# Patient Record
Sex: Male | Born: 1945
Health system: Southern US, Community
[De-identification: ages and names within clinical notes are randomized; demographics above are authoritative.]

## PROBLEM LIST (undated history)

## (undated) DIAGNOSIS — I10 Essential (primary) hypertension: Secondary | ICD-10-CM

## (undated) HISTORY — DX: Essential (primary) hypertension: I10

---

## 2005-04-06 ENCOUNTER — Encounter: Admission: RE | Admit: 2005-04-06 | Discharge: 2005-04-06 | Payer: Self-pay | Admitting: Internal Medicine

## 2005-12-03 ENCOUNTER — Ambulatory Visit (HOSPITAL_COMMUNITY): Admission: RE | Admit: 2005-12-03 | Discharge: 2005-12-03 | Payer: Self-pay | Admitting: Gastroenterology

## 2006-07-01 IMAGING — CR DG CHEST 2V
3 series · 3 of 3 positions shown · non-contrast
Comparison: None.

CLINICAL DATA: Cough.

CHEST - 2 VIEW  04/06/2005:

[view not recorded (1 of 3)]
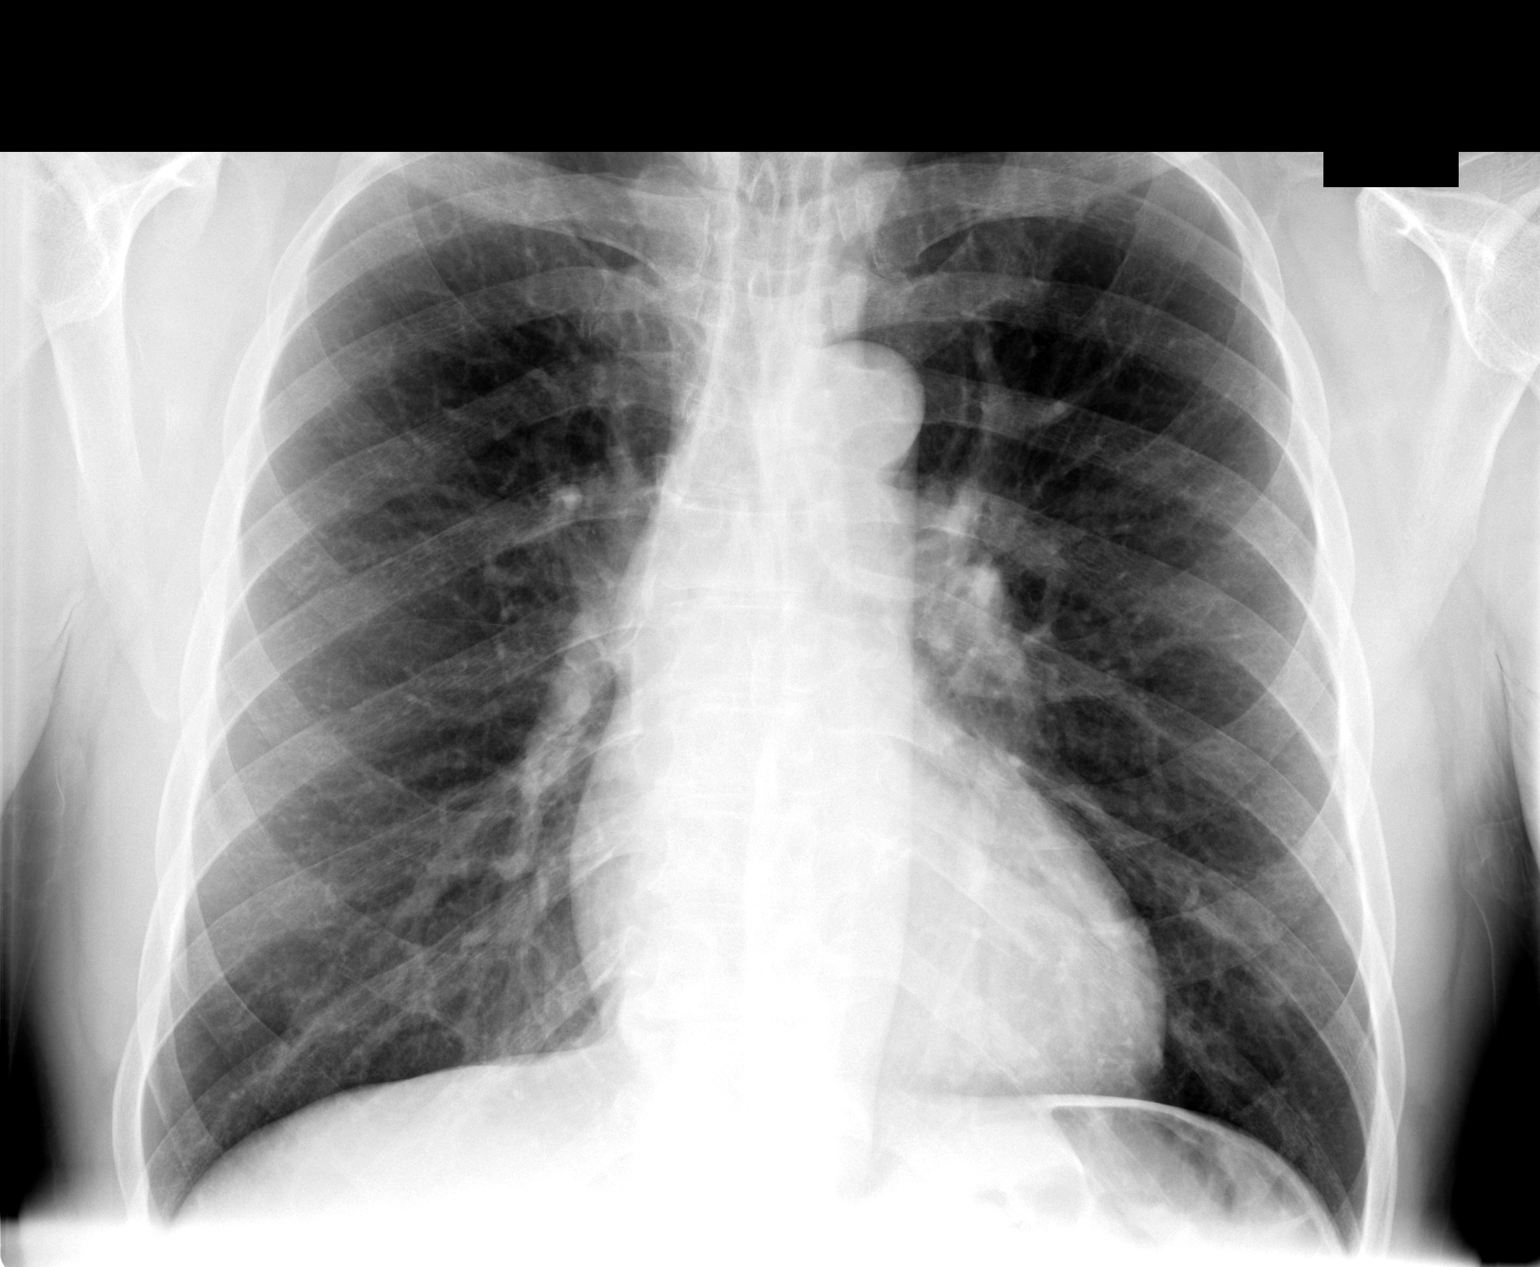

[view not recorded (2 of 3)]
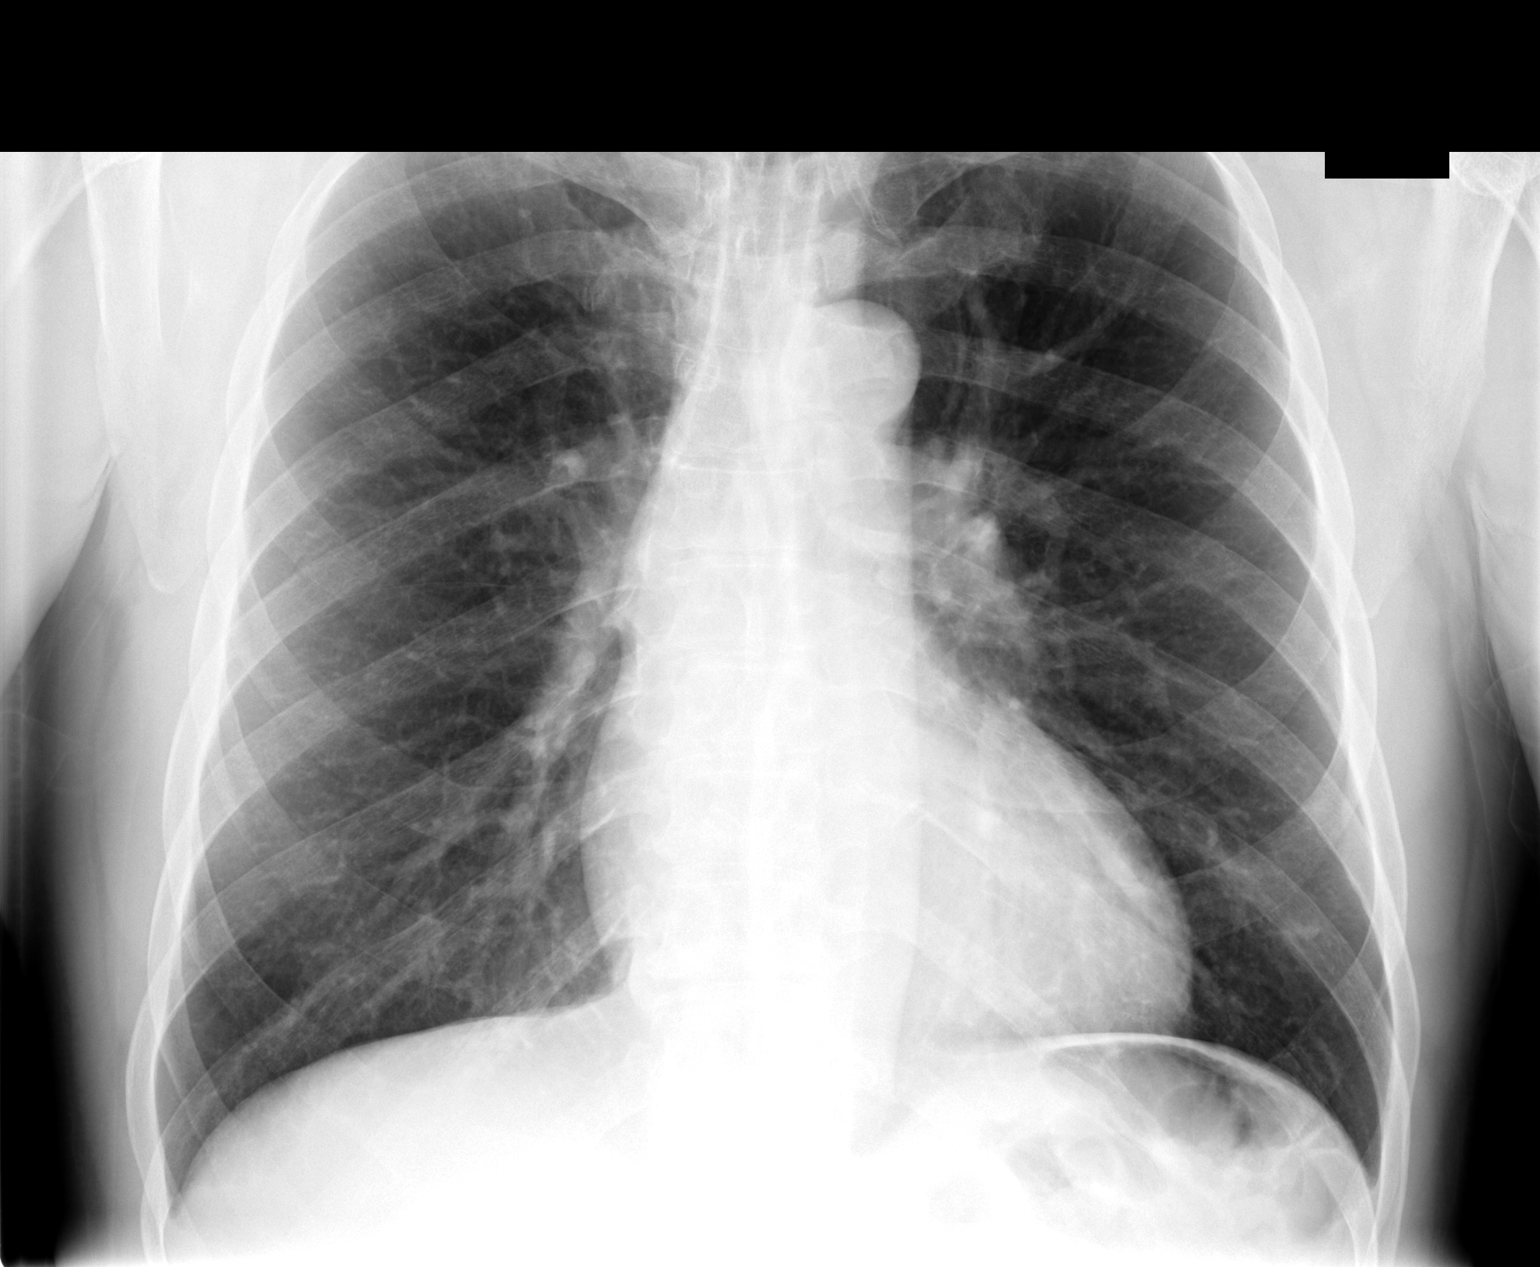

[view not recorded (3 of 3)]
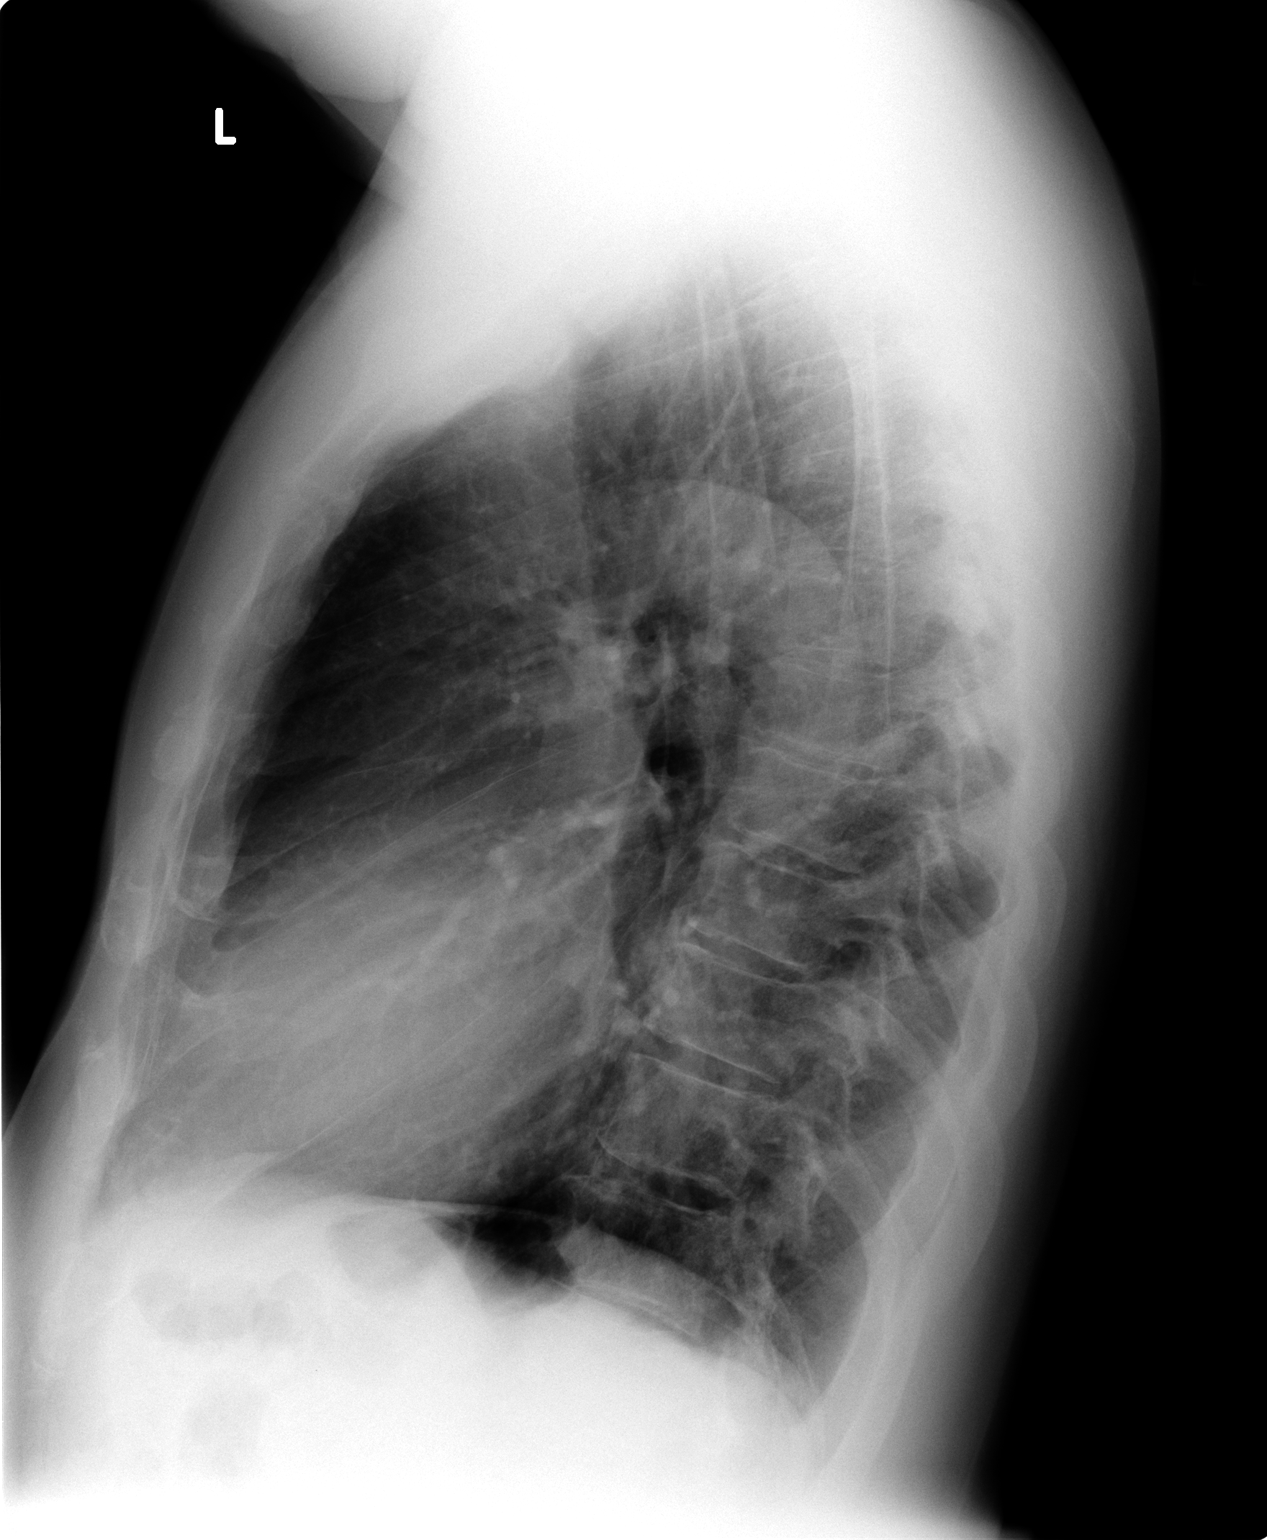

[3 of 3 positions shown; findings below may reference images not displayed]

FINDINGS: The heart is upper normal in size. The hilar and mediastinal contours
are otherwise unremarkable. The lungs appear clear. A curvilinear opacity at the
left lung base I believe likely represents the nipple. There are no pleural
effusions. Mild degenerative changes are present in the thoracic spine.
IMPRESSION: Borderline heart size. No evidence of acute disease.

## 2016-08-27 DIAGNOSIS — Z23 Encounter for immunization: Secondary | ICD-10-CM | POA: Diagnosis not present

## 2017-01-27 DIAGNOSIS — R7309 Other abnormal glucose: Secondary | ICD-10-CM | POA: Diagnosis not present

## 2017-01-27 DIAGNOSIS — E782 Mixed hyperlipidemia: Secondary | ICD-10-CM | POA: Diagnosis not present

## 2017-01-27 DIAGNOSIS — Z8546 Personal history of malignant neoplasm of prostate: Secondary | ICD-10-CM | POA: Diagnosis not present

## 2017-01-27 DIAGNOSIS — Z1211 Encounter for screening for malignant neoplasm of colon: Secondary | ICD-10-CM | POA: Diagnosis not present

## 2017-01-27 DIAGNOSIS — Z13228 Encounter for screening for other metabolic disorders: Secondary | ICD-10-CM | POA: Diagnosis not present

## 2017-05-06 DIAGNOSIS — N390 Urinary tract infection, site not specified: Secondary | ICD-10-CM | POA: Diagnosis not present

## 2017-05-06 DIAGNOSIS — Z1211 Encounter for screening for malignant neoplasm of colon: Secondary | ICD-10-CM | POA: Diagnosis not present

## 2017-05-06 DIAGNOSIS — R030 Elevated blood-pressure reading, without diagnosis of hypertension: Secondary | ICD-10-CM | POA: Diagnosis not present

## 2017-05-06 DIAGNOSIS — Z23 Encounter for immunization: Secondary | ICD-10-CM | POA: Diagnosis not present

## 2017-05-06 DIAGNOSIS — E782 Mixed hyperlipidemia: Secondary | ICD-10-CM | POA: Diagnosis not present

## 2017-05-06 DIAGNOSIS — Z0000 Encounter for general adult medical examination without abnormal findings: Secondary | ICD-10-CM | POA: Diagnosis not present

## 2017-05-13 DIAGNOSIS — R030 Elevated blood-pressure reading, without diagnosis of hypertension: Secondary | ICD-10-CM | POA: Diagnosis not present

## 2017-05-18 DIAGNOSIS — K4090 Unilateral inguinal hernia, without obstruction or gangrene, not specified as recurrent: Secondary | ICD-10-CM | POA: Diagnosis not present

## 2017-05-18 DIAGNOSIS — Z1211 Encounter for screening for malignant neoplasm of colon: Secondary | ICD-10-CM | POA: Diagnosis not present

## 2017-05-18 DIAGNOSIS — C61 Malignant neoplasm of prostate: Secondary | ICD-10-CM | POA: Diagnosis not present

## 2017-06-03 DIAGNOSIS — R030 Elevated blood-pressure reading, without diagnosis of hypertension: Secondary | ICD-10-CM | POA: Diagnosis not present

## 2017-06-21 DIAGNOSIS — Z1211 Encounter for screening for malignant neoplasm of colon: Secondary | ICD-10-CM | POA: Diagnosis not present

## 2017-06-21 DIAGNOSIS — K627 Radiation proctitis: Secondary | ICD-10-CM | POA: Diagnosis not present

## 2017-07-15 DIAGNOSIS — I10 Essential (primary) hypertension: Secondary | ICD-10-CM | POA: Diagnosis not present

## 2017-08-26 DIAGNOSIS — I10 Essential (primary) hypertension: Secondary | ICD-10-CM | POA: Diagnosis not present

## 2017-08-26 DIAGNOSIS — Z23 Encounter for immunization: Secondary | ICD-10-CM | POA: Diagnosis not present

## 2017-11-07 DIAGNOSIS — E782 Mixed hyperlipidemia: Secondary | ICD-10-CM | POA: Diagnosis not present

## 2017-11-07 DIAGNOSIS — I10 Essential (primary) hypertension: Secondary | ICD-10-CM | POA: Diagnosis not present

## 2018-05-18 DIAGNOSIS — E049 Nontoxic goiter, unspecified: Secondary | ICD-10-CM | POA: Diagnosis not present

## 2018-05-18 DIAGNOSIS — I10 Essential (primary) hypertension: Secondary | ICD-10-CM | POA: Diagnosis not present

## 2018-05-18 DIAGNOSIS — N390 Urinary tract infection, site not specified: Secondary | ICD-10-CM | POA: Diagnosis not present

## 2018-05-18 DIAGNOSIS — E782 Mixed hyperlipidemia: Secondary | ICD-10-CM | POA: Diagnosis not present

## 2018-05-31 ENCOUNTER — Other Ambulatory Visit: Payer: Self-pay

## 2018-07-06 DIAGNOSIS — N3946 Mixed incontinence: Secondary | ICD-10-CM | POA: Diagnosis not present

## 2018-07-06 DIAGNOSIS — R351 Nocturia: Secondary | ICD-10-CM | POA: Diagnosis not present

## 2018-07-06 DIAGNOSIS — R350 Frequency of micturition: Secondary | ICD-10-CM | POA: Diagnosis not present

## 2018-08-17 DIAGNOSIS — R350 Frequency of micturition: Secondary | ICD-10-CM | POA: Diagnosis not present

## 2018-08-17 DIAGNOSIS — N3946 Mixed incontinence: Secondary | ICD-10-CM | POA: Diagnosis not present

## 2018-10-10 DIAGNOSIS — N3946 Mixed incontinence: Secondary | ICD-10-CM | POA: Diagnosis not present

## 2018-10-10 DIAGNOSIS — R350 Frequency of micturition: Secondary | ICD-10-CM | POA: Diagnosis not present

## 2018-10-10 DIAGNOSIS — R351 Nocturia: Secondary | ICD-10-CM | POA: Diagnosis not present

## 2018-10-11 DIAGNOSIS — Z23 Encounter for immunization: Secondary | ICD-10-CM | POA: Diagnosis not present

## 2018-11-16 ENCOUNTER — Ambulatory Visit: Payer: Self-pay | Admitting: Nurse Practitioner

## 2019-01-11 DIAGNOSIS — R350 Frequency of micturition: Secondary | ICD-10-CM | POA: Diagnosis not present

## 2019-01-11 DIAGNOSIS — N3946 Mixed incontinence: Secondary | ICD-10-CM | POA: Diagnosis not present

## 2019-02-21 DIAGNOSIS — N3946 Mixed incontinence: Secondary | ICD-10-CM | POA: Diagnosis not present

## 2019-02-21 DIAGNOSIS — R351 Nocturia: Secondary | ICD-10-CM | POA: Diagnosis not present

## 2019-02-21 DIAGNOSIS — R350 Frequency of micturition: Secondary | ICD-10-CM | POA: Diagnosis not present

## 2019-02-28 DIAGNOSIS — N3946 Mixed incontinence: Secondary | ICD-10-CM | POA: Diagnosis not present

## 2019-02-28 DIAGNOSIS — R350 Frequency of micturition: Secondary | ICD-10-CM | POA: Diagnosis not present

## 2019-03-22 DIAGNOSIS — R350 Frequency of micturition: Secondary | ICD-10-CM | POA: Diagnosis not present

## 2019-03-22 DIAGNOSIS — N3946 Mixed incontinence: Secondary | ICD-10-CM | POA: Diagnosis not present

## 2019-03-30 DIAGNOSIS — R350 Frequency of micturition: Secondary | ICD-10-CM | POA: Diagnosis not present

## 2019-04-06 DIAGNOSIS — R350 Frequency of micturition: Secondary | ICD-10-CM | POA: Diagnosis not present

## 2019-04-12 DIAGNOSIS — R350 Frequency of micturition: Secondary | ICD-10-CM | POA: Diagnosis not present

## 2019-04-19 DIAGNOSIS — R350 Frequency of micturition: Secondary | ICD-10-CM | POA: Diagnosis not present

## 2019-04-26 DIAGNOSIS — N3946 Mixed incontinence: Secondary | ICD-10-CM | POA: Diagnosis not present

## 2019-04-26 DIAGNOSIS — R350 Frequency of micturition: Secondary | ICD-10-CM | POA: Diagnosis not present

## 2019-04-26 DIAGNOSIS — R351 Nocturia: Secondary | ICD-10-CM | POA: Diagnosis not present

## 2019-05-03 DIAGNOSIS — R350 Frequency of micturition: Secondary | ICD-10-CM | POA: Diagnosis not present

## 2019-05-10 DIAGNOSIS — R350 Frequency of micturition: Secondary | ICD-10-CM | POA: Diagnosis not present

## 2019-05-17 DIAGNOSIS — R350 Frequency of micturition: Secondary | ICD-10-CM | POA: Diagnosis not present

## 2019-05-24 ENCOUNTER — Ambulatory Visit: Payer: Self-pay | Admitting: Nurse Practitioner

## 2019-05-24 ENCOUNTER — Ambulatory Visit: Payer: Self-pay

## 2019-05-24 DIAGNOSIS — R350 Frequency of micturition: Secondary | ICD-10-CM | POA: Diagnosis not present

## 2019-05-31 DIAGNOSIS — R350 Frequency of micturition: Secondary | ICD-10-CM | POA: Diagnosis not present

## 2019-06-07 DIAGNOSIS — R351 Nocturia: Secondary | ICD-10-CM | POA: Diagnosis not present

## 2019-06-07 DIAGNOSIS — R350 Frequency of micturition: Secondary | ICD-10-CM | POA: Diagnosis not present

## 2019-06-10 ENCOUNTER — Other Ambulatory Visit: Payer: Self-pay | Admitting: Nurse Practitioner

## 2019-06-13 ENCOUNTER — Other Ambulatory Visit: Payer: Self-pay

## 2019-06-13 ENCOUNTER — Encounter: Payer: Self-pay | Admitting: Nurse Practitioner

## 2019-06-13 ENCOUNTER — Ambulatory Visit (INDEPENDENT_AMBULATORY_CARE_PROVIDER_SITE_OTHER): Payer: Medicare Other | Admitting: Nurse Practitioner

## 2019-06-13 ENCOUNTER — Ambulatory Visit (INDEPENDENT_AMBULATORY_CARE_PROVIDER_SITE_OTHER): Payer: Medicare Other

## 2019-06-13 VITALS — BP 148/78 | HR 62 | Temp 98.7°F | Ht 73.0 in | Wt 204.2 lb

## 2019-06-13 VITALS — BP 148/78 | HR 62 | Temp 98.7°F | Ht 72.0 in | Wt 204.2 lb

## 2019-06-13 DIAGNOSIS — E785 Hyperlipidemia, unspecified: Secondary | ICD-10-CM

## 2019-06-13 DIAGNOSIS — I10 Essential (primary) hypertension: Secondary | ICD-10-CM

## 2019-06-13 DIAGNOSIS — Z0000 Encounter for general adult medical examination without abnormal findings: Secondary | ICD-10-CM

## 2019-06-13 DIAGNOSIS — Z23 Encounter for immunization: Secondary | ICD-10-CM

## 2019-06-13 LAB — POCT URINALYSIS DIPSTICK
Bilirubin, UA: NEGATIVE
Blood, UA: NEGATIVE
Glucose, UA: NEGATIVE
Ketones, UA: NEGATIVE
Leukocytes, UA: NEGATIVE
Nitrite, UA: NEGATIVE
Protein, UA: NEGATIVE
Spec Grav, UA: 1.025 (ref 1.010–1.025)
Urobilinogen, UA: 0.2 E.U./dL
pH, UA: 7 (ref 5.0–8.0)

## 2019-06-13 LAB — POCT UA - MICROALBUMIN
Albumin/Creatinine Ratio, Urine, POC: <30
Creatinine, POC: 200 mg/dL
Microalbumin Ur, POC: 10 mg/L

## 2019-06-13 MED ORDER — BOOSTRIX 5-2.5-18.5 LF-MCG/0.5 IM SUSP: 0.5000 mL | Freq: Once | INTRAMUSCULAR | 0 refills | Status: AC

## 2019-06-13 NOTE — Patient Instructions (Signed)

## 2019-06-13 NOTE — Patient Instructions (Signed)
David Wood , Thank you for taking time to come for your Medicare Wellness Visit. I appreciate your ongoing commitment to your health goals. Please review the following plan we discussed and let me know if I can assist you in the future.   Screening recommendations/referrals: Colonoscopy: 06/2017 Recommended yearly ophthalmology/optometry visit for glaucoma screening and checkup Recommended yearly dental visit for hygiene and checkup  Vaccinations: Influenza vaccine: 10/2018 Pneumococcal vaccine: 05/2017 Tdap vaccine: due Shingles vaccine: discussed    Advanced directives: Advance directive discussed with you today. Even though you declined this today please call our office should you change your mind and we can give you the proper paperwork for you to fill out.   Conditions/risks identified: overweight  Next appointment: 10/09/2019 at 8:45  Preventive Care 16 Years and Older, Male Preventive care refers to lifestyle choices and visits with your health care provider that can promote health and wellness. What does preventive care include?  A yearly physical exam. This is also called an annual well check.  Dental exams once or twice a year.  Routine eye exams. Ask your health care provider how often you should have your eyes checked.  Personal lifestyle choices, including:  Daily care of your teeth and gums.  Regular physical activity.  Eating a healthy diet.  Avoiding tobacco and drug use.  Limiting alcohol use.  Practicing safe sex.  Taking low doses of aspirin every day.  Taking vitamin and mineral supplements as recommended by your health care provider. What happens during an annual well check? The services and screenings done by your health care provider during your annual well check will depend on your age, overall health, lifestyle risk factors, and family history of disease. Counseling  Your health care provider may ask you questions about your:  Alcohol use.   Tobacco use.  Drug use.  Emotional well-being.  Home and relationship well-being.  Sexual activity.  Eating habits.  History of falls.  Memory and ability to understand (cognition).  Work and work Statistician. Screening  You may have the following tests or measurements:  Height, weight, and BMI.  Blood pressure.  Lipid and cholesterol levels. These may be checked every 5 years, or more frequently if you are over 34 years old.  Skin check.  Lung cancer screening. You may have this screening every year starting at age 39 if you have a 30-pack-year history of smoking and currently smoke or have quit within the past 15 years.  Fecal occult blood test (FOBT) of the stool. You may have this test every year starting at age 63.  Flexible sigmoidoscopy or colonoscopy. You may have a sigmoidoscopy every 5 years or a colonoscopy every 10 years starting at age 80.  Prostate cancer screening. Recommendations will vary depending on your family history and other risks.  Hepatitis C blood test.  Hepatitis B blood test.  Sexually transmitted disease (STD) testing.  Diabetes screening. This is done by checking your blood sugar (glucose) after you have not eaten for a while (fasting). You may have this done every 1-3 years.  Abdominal aortic aneurysm (AAA) screening. You may need this if you are a current or former smoker.  Osteoporosis. You may be screened starting at age 40 if you are at high risk. Talk with your health care provider about your test results, treatment options, and if necessary, the need for more tests. Vaccines  Your health care provider may recommend certain vaccines, such as:  Influenza vaccine. This is recommended every year.  Tetanus, diphtheria, and acellular pertussis (Tdap, Td) vaccine. You may need a Td booster every 10 years.  Zoster vaccine. You may need this after age 82.  Pneumococcal 13-valent conjugate (PCV13) vaccine. One dose is recommended  after age 74.  Pneumococcal polysaccharide (PPSV23) vaccine. One dose is recommended after age 59. Talk to your health care provider about which screenings and vaccines you need and how often you need them. This information is not intended to replace advice given to you by your health care provider. Make sure you discuss any questions you have with your health care provider. Document Released: 11/14/2015 Document Revised: 07/07/2016 Document Reviewed: 08/19/2015 Elsevier Interactive Patient Education  2017 [place] Prevention in the Home Falls can cause injuries. They can happen to people of all ages. There are many things you can do to make your home safe and to help prevent falls. What can I do on the outside of my home?  Regularly fix the edges of walkways and driveways and fix any cracks.  Remove anything that might make you trip as you walk through a door, such as a raised step or threshold.  Trim any bushes or trees on the path to your home.  Use bright outdoor lighting.  Clear any walking paths of anything that might make someone trip, such as rocks or tools.  Regularly check to see if handrails are loose or broken. Make sure that both sides of any steps have handrails.  Any raised decks and porches should have guardrails on the edges.  Have any leaves, snow, or ice cleared regularly.  Use sand or salt on walking paths during winter.  Clean up any spills in your garage right away. This includes oil or grease spills. What can I do in the bathroom?  Use night lights.  Install grab bars by the toilet and in the tub and shower. Do not use towel bars as grab bars.  Use non-skid mats or decals in the tub or shower.  If you need to sit down in the shower, use a plastic, non-slip stool.  Keep the floor dry. Clean up any water that spills on the floor as soon as it happens.  Remove soap buildup in the tub or shower regularly.  Attach bath mats securely with  double-sided non-slip rug tape.  Do not have throw rugs and other things on the floor that can make you trip. What can I do in the bedroom?  Use night lights.  Make sure that you have a light by your bed that is easy to reach.  Do not use any sheets or blankets that are too big for your bed. They should not hang down onto the floor.  Have a firm chair that has side arms. You can use this for support while you get dressed.  Do not have throw rugs and other things on the floor that can make you trip. What can I do in the kitchen?  Clean up any spills right away.  Avoid walking on wet floors.  Keep items that you use a lot in easy-to-reach places.  If you need to reach something above you, use a strong step stool that has a grab bar.  Keep electrical cords out of the way.  Do not use floor polish or wax that makes floors slippery. If you must use wax, use non-skid floor wax.  Do not have throw rugs and other things on the floor that can make you trip. What can I do  with my stairs?  Do not leave any items on the stairs.  Make sure that there are handrails on both sides of the stairs and use them. Fix handrails that are broken or loose. Make sure that handrails are as long as the stairways.  Check any carpeting to make sure that it is firmly attached to the stairs. Fix any carpet that is loose or worn.  Avoid having throw rugs at the top or bottom of the stairs. If you do have throw rugs, attach them to the floor with carpet tape.  Make sure that you have a light switch at the top of the stairs and the bottom of the stairs. If you do not have them, ask someone to add them for you. What else can I do to help prevent falls?  Wear shoes that:  Do not have high heels.  Have rubber bottoms.  Are comfortable and fit you well.  Are closed at the toe. Do not wear sandals.  If you use a stepladder:  Make sure that it is fully opened. Do not climb a closed stepladder.  Make  sure that both sides of the stepladder are locked into place.  Ask someone to hold it for you, if possible.  Clearly mark and make sure that you can see:  Any grab bars or handrails.  First and last steps.  Where the edge of each step is.  Use tools that help you move around (mobility aids) if they are needed. These include:  Canes.  Walkers.  Scooters.  Crutches.  Turn on the lights when you go into a dark area. Replace any light bulbs as soon as they burn out.  Set up your furniture so you have a clear path. Avoid moving your furniture around.  If any of your floors are uneven, fix them.  If there are any pets around you, be aware of where they are.  Review your medicines with your doctor. Some medicines can make you feel dizzy. This can increase your chance of falling. Ask your doctor what other things that you can do to help prevent falls. This information is not intended to replace advice given to you by your health care provider. Make sure you discuss any questions you have with your health care provider. Document Released: 08/14/2009 Document Revised: 03/25/2016 Document Reviewed: 11/22/2014 Elsevier Interactive Patient Education  2017 [employer].

## 2019-06-13 NOTE — Progress Notes (Signed)
  Subjective:     Patient ID: David Wood , male    DOB: 08-04-1946 , 73 y.o.   MRN: [id number]   Chief Complaint  Patient presents with  . Hypertension    HPI  Walking 7 days a week for about one hour  Hypertension This is a chronic problem. The current episode started more than 1 year ago. The problem is unchanged. The problem is controlled. Pertinent negatives include no anxiety, chest pain, headaches or palpitations. There are no associated agents to hypertension. Risk factors for coronary artery disease include male gender. Past treatments include angiotensin blockers. The current treatment provides no improvement. There are no compliance problems.  There is no history of chronic renal disease.     Past Medical History:  Diagnosis Date  . Hypertension      No family history on file.   Current Outpatient Medications:  .  olmesartan (BENICAR) 20 MG tablet, TAKE 1/2 TABLET BY ORAL ROUTE EVERY DAY, Disp: 30 tablet, Rfl: 0 .  Tdap (BOOSTRIX) 5-2.5-18.5 LF-MCG/0.5 injection, Inject 0.5 mLs into the muscle once for 1 dose., Disp: 0.5 mL, Rfl: 0   No Known Allergies   Review of Systems  Constitutional: Negative.   Respiratory: Negative.   Cardiovascular: Negative.  Negative for chest pain, palpitations and leg swelling.  Neurological: Negative.  Negative for dizziness and headaches.  Psychiatric/Behavioral: Negative.      Today's Vitals   06/13/19 0902  BP: (!) 148/78  Pulse: 62  Temp: 98.7 F (37.1 C)  TempSrc: Oral  Weight: 204 lb 3.2 oz (92.6 kg)  Height: _0  (1.854 m)  PainSc: 0-No pain   Body mass index is 26.94 kg/m.   Objective:  Physical Exam Constitutional:      Appearance: Normal appearance.  Cardiovascular:     Rate and Rhythm: Normal rate and regular rhythm.     Pulses: Normal pulses.     Heart sounds: Normal heart sounds. No murmur.  Pulmonary:     Effort: Pulmonary effort is normal. No respiratory distress.     Breath sounds: Normal  breath sounds.  Skin:    General: Skin is warm and dry.     Capillary Refill: Capillary refill takes less than 2 seconds.  Neurological:     General: No focal deficit present.     Mental Status: He is alert and oriented to person, place, and time.  Psychiatric:        Mood and Affect: Mood normal.        Behavior: Behavior normal.        Thought Content: Thought content normal.        Judgment: Judgment normal.         Assessment And Plan:     1. Essential hypertension . B/P is fairly controlled, slightly elevated today however I will not make any changes today  . BMP ordered to check renal function.  . The importance of regular exercise and dietary modification was stressed to the patient.  - BMP8+eGFR  2. Elevated lipids  LDLs were slightly elevated at last visit, will recheck today  Encouraged to avoid high fat and fried foods. - Lipid panel   Minette Brine, FNP    THE PATIENT IS ENCOURAGED TO PRACTICE SOCIAL DISTANCING DUE TO THE COVID-19 PANDEMIC.

## 2019-06-13 NOTE — Progress Notes (Signed)
Subjective:   David Wood is a 73 y.o. male who presents for Medicare Annual/Subsequent preventive examination.  Review of Systems:  n/a Cardiac Risk Factors include: advanced age (>40men, >72 women);hypertension;male gender     Objective:    Vitals: BP (!) 148/78 (BP Location: Left Arm, Patient Position: Sitting, Cuff Size: Normal)   Pulse 62   Temp 98.7 F (37.1 C) (Oral)   Ht 6' (1.829 m)   Wt 204 lb 3.2 oz (92.6 kg)   SpO2 97%   BMI 27.69 kg/m   Body mass index is 27.69 kg/m.  Advanced Directives 06/13/2019  Does Patient Have a Medical Advance Directive? No  Would patient like information on creating a medical advance directive? No - Patient declined    Tobacco Social History   Tobacco Use  Smoking Status Never Smoker  Smokeless Tobacco Never Used     Counseling given: Not Answered   Clinical Intake:  Pre-visit preparation completed: Yes  Pain : No/denies pain     Nutritional Status: BMI 25 -29 Overweight Nutritional Risks: None Diabetes: No  How often do you need to have someone help you when you read instructions, pamphlets, or other written materials from your doctor or pharmacy?: 1 - Never What is the last grade level you completed in school?: 2 years college  Interpreter Needed?: No  Information entered by :: NAllen LPN  Past Medical History:  Diagnosis Date  . Hypertension    Past Surgical History:  Procedure Laterality Date  . HERNIA REPAIR  1998  . [place]   History reviewed. No pertinent family history. Social History   Socioeconomic History  . Marital status: Married    Spouse name: Not on file  . Number of children: Not on file  . Years of education: Not on file  . Highest education level: Not on file  Occupational History  . Occupation: retired  Scientific laboratory technician  . Financial resource strain: Not hard at all  . Food insecurity    Worry: Never true    Inability: Never true  . Transportation needs   Medical: No    Non-medical: No  Tobacco Use  . Smoking status: Never Smoker  . Smokeless tobacco: Never Used  Substance and Sexual Activity  . Alcohol use: Not Currently  . Drug use: Not Currently  . Sexual activity: Yes  Lifestyle  . Physical activity    Days per week: 7 days    Minutes per session: 60 min  . Stress: Not at all  Relationships  . Social Herbalist on phone: Not on file    Gets together: Not on file    Attends religious service: Not on file    Active member of club or organization: Not on file    Attends meetings of clubs or organizations: Not on file    Relationship status: Not on file  Other Topics Concern  . Not on file  Social History Narrative  . Not on file    Outpatient Encounter Medications as of 06/13/2019  Medication Sig  . olmesartan (BENICAR) 20 MG tablet TAKE 1/2 TABLET BY ORAL ROUTE EVERY DAY   No facility-administered encounter medications on file as of 06/13/2019.     Activities of Daily Living In your present state of health, do you have any difficulty performing the following activities: 06/13/2019  Hearing? N  Vision? N  Difficulty concentrating or making decisions? N  Walking or climbing stairs? N  Dressing or  bathing? N  Doing errands, shopping? N  Preparing Food and eating ? N  Using the Toilet? N  In the past six months, have you accidently leaked urine? Y  Do you have problems with loss of bowel control? N  Managing your Medications? N  Managing your Finances? N  Housekeeping or managing your Housekeeping? N  Some recent data might be hidden    Patient Care Team: Minette Brine, FNP as PCP - General (General Practice)   Assessment:   This is a routine wellness examination for David Wood.  Exercise Activities and Dietary recommendations Current Exercise Habits: Home exercise routine, Type of exercise: walking, Time (Minutes): 60, Frequency (Times/Week): 7, Weekly Exercise (Minutes/Week): 420  Goals    . Weight (lb)  < 200 lb (90.7 kg) (pt-stated)       Fall Risk Fall Risk  06/13/2019 05/31/2018  Falls in the past year? 0 No  Comment - Emmi Telephone Survey: data to providers prior to load  Risk for fall due to : Medication side effect -  Follow up Falls evaluation completed;Education provided;Falls prevention discussed -   Is the patient's home free of loose throw rugs in walkways, pet beds, electrical cords, etc?   yes      Grab bars in the bathroom? yes      Handrails on the stairs?   n/a      Adequate lighting?   yes  Timed Get Up and Go Performed: n/a  Depression Screen PHQ 2/9 Scores 06/13/2019  PHQ - 2 Score 0  PHQ- 9 Score 0    Cognitive Function     6CIT Screen 06/13/2019  What Year? 0 points  What month? 0 points  What time? 0 points  Count back from 20 0 points  Months in reverse 0 points  Repeat phrase 0 points  Total Score 0     There is no immunization history on file for this patient.  Qualifies for Shingles Vaccine? yes  Screening Tests Health Maintenance  Topic Date Due  . Hepatitis C Screening  [birth year]  . TETANUS/TDAP  01/12/1967  . COLONOSCOPY  01/11/1998  . PNA vac Low Risk Adult (1 of 2 - PCV13) 01/11/2013  . INFLUENZA VACCINE  06/02/2019   Cancer Screenings: Lung: Low Dose CT Chest recommended if Age 83-80 years, 30 pack-year currently smoking OR have quit w/in 15years. Patient does not qualify. Colorectal: 06/2017  Additional Screenings:  Hepatitis C Screening: today      Plan:    Patient wants to lose 4 pounds.  I have personally reviewed and noted the following in the patient's chart:   . Medical and social history . Use of alcohol, tobacco or illicit drugs  . Current medications and supplements . Functional ability and status . Nutritional status . Physical activity . Advanced directives . List of other physicians . Hospitalizations, surgeries, and ER visits in previous 12 months . Vitals . Screenings to include cognitive,  depression, and falls . Referrals and appointments  In addition, I have reviewed and discussed with patient certain preventive protocols, quality metrics, and best practice recommendations. A written personalized care plan for preventive services as well as general preventive health recommendations were provided to patient.     Kellie Simmering, LPN  [id number]

## 2019-06-14 LAB — HEPATITIS C ANTIBODY: Hep C Virus Ab: <0.1 s/co ratio (ref 0.0–0.9)

## 2019-06-15 LAB — LIPID PANEL

## 2019-06-16 LAB — SPECIMEN STATUS REPORT

## 2019-06-16 LAB — BMP8+EGFR
BUN/Creatinine Ratio: 15 (ref 10–24)
BUN: 16 mg/dL (ref 8–27)
CO2: 20 mmol/L (ref 20–29)
Calcium: 10.3 mg/dL — ABNORMAL HIGH (ref 8.6–10.2)
Chloride: 107 mmol/L — ABNORMAL HIGH (ref 96–106)
Creatinine, Ser: 1.05 mg/dL (ref 0.76–1.27)
GFR calc Af Amer: 82 mL/min/{1.73_m2} (ref >59)
GFR calc non Af Amer: 71 mL/min/{1.73_m2} (ref >59)
Glucose: 102 mg/dL — ABNORMAL HIGH (ref 65–99)
Potassium: 4.8 mmol/L (ref 3.5–5.2)
Sodium: 144 mmol/L (ref 134–144)

## 2019-06-16 LAB — LIPID PANEL
Chol/HDL Ratio: 3.3 ratio (ref 0.0–5.0)
Cholesterol, Total: 167 mg/dL (ref 100–199)
HDL: 51 mg/dL (ref >39)
LDL Calculated: 104 mg/dL — ABNORMAL HIGH (ref 0–99)
Triglycerides: 61 mg/dL (ref 0–149)
VLDL Cholesterol Cal: 12 mg/dL (ref 5–40)

## 2019-06-28 DIAGNOSIS — R350 Frequency of micturition: Secondary | ICD-10-CM | POA: Diagnosis not present

## 2019-07-19 DIAGNOSIS — R350 Frequency of micturition: Secondary | ICD-10-CM | POA: Diagnosis not present

## 2019-07-21 DIAGNOSIS — Z23 Encounter for immunization: Secondary | ICD-10-CM | POA: Diagnosis not present

## 2019-08-03 ENCOUNTER — Other Ambulatory Visit: Payer: Self-pay | Admitting: Nurse Practitioner

## 2019-08-09 DIAGNOSIS — R350 Frequency of micturition: Secondary | ICD-10-CM | POA: Diagnosis not present

## 2019-08-28 DIAGNOSIS — R350 Frequency of micturition: Secondary | ICD-10-CM | POA: Diagnosis not present

## 2019-09-20 DIAGNOSIS — R350 Frequency of micturition: Secondary | ICD-10-CM | POA: Diagnosis not present

## 2019-10-01 DIAGNOSIS — Z1211 Encounter for screening for malignant neoplasm of colon: Secondary | ICD-10-CM | POA: Diagnosis not present

## 2019-10-01 DIAGNOSIS — Z0000 Encounter for general adult medical examination without abnormal findings: Secondary | ICD-10-CM | POA: Diagnosis not present

## 2019-10-01 DIAGNOSIS — Z6826 Body mass index (BMI) 26.0-26.9, adult: Secondary | ICD-10-CM | POA: Diagnosis not present

## 2019-10-01 DIAGNOSIS — M19041 Primary osteoarthritis, right hand: Secondary | ICD-10-CM | POA: Diagnosis not present

## 2019-10-01 DIAGNOSIS — Z008 Encounter for other general examination: Secondary | ICD-10-CM | POA: Diagnosis not present

## 2019-10-01 DIAGNOSIS — Z7189 Other specified counseling: Secondary | ICD-10-CM | POA: Diagnosis not present

## 2019-10-01 DIAGNOSIS — E663 Overweight: Secondary | ICD-10-CM | POA: Diagnosis not present

## 2019-10-01 DIAGNOSIS — Z0001 Encounter for general adult medical examination with abnormal findings: Secondary | ICD-10-CM | POA: Diagnosis not present

## 2019-10-01 DIAGNOSIS — Z1159 Encounter for screening for other viral diseases: Secondary | ICD-10-CM | POA: Diagnosis not present

## 2019-10-01 DIAGNOSIS — Z79899 Other long term (current) drug therapy: Secondary | ICD-10-CM | POA: Diagnosis not present

## 2019-10-01 DIAGNOSIS — I10 Essential (primary) hypertension: Secondary | ICD-10-CM | POA: Diagnosis not present

## 2019-10-09 ENCOUNTER — Encounter: Payer: Medicare Other | Admitting: Nurse Practitioner

## 2019-10-11 DIAGNOSIS — R350 Frequency of micturition: Secondary | ICD-10-CM | POA: Diagnosis not present

## 2019-10-22 DIAGNOSIS — Z008 Encounter for other general examination: Secondary | ICD-10-CM | POA: Diagnosis not present

## 2019-10-22 DIAGNOSIS — Z0000 Encounter for general adult medical examination without abnormal findings: Secondary | ICD-10-CM | POA: Diagnosis not present

## 2019-10-22 DIAGNOSIS — E663 Overweight: Secondary | ICD-10-CM | POA: Diagnosis not present

## 2019-10-22 DIAGNOSIS — I10 Essential (primary) hypertension: Secondary | ICD-10-CM | POA: Diagnosis not present

## 2019-10-22 DIAGNOSIS — Z6826 Body mass index (BMI) 26.0-26.9, adult: Secondary | ICD-10-CM | POA: Diagnosis not present

## 2019-10-22 DIAGNOSIS — Z23 Encounter for immunization: Secondary | ICD-10-CM | POA: Diagnosis not present

## 2019-12-06 DIAGNOSIS — R350 Frequency of micturition: Secondary | ICD-10-CM | POA: Diagnosis not present

## 2019-12-19 ENCOUNTER — Ambulatory Visit: Payer: Medicare Other | Admitting: Nurse Practitioner

## 2019-12-27 DIAGNOSIS — R350 Frequency of micturition: Secondary | ICD-10-CM | POA: Diagnosis not present

## 2020-01-11 DIAGNOSIS — E0590 Thyrotoxicosis, unspecified without thyrotoxic crisis or storm: Secondary | ICD-10-CM | POA: Diagnosis not present

## 2020-01-24 DIAGNOSIS — R350 Frequency of micturition: Secondary | ICD-10-CM | POA: Diagnosis not present

## 2020-01-25 DIAGNOSIS — I10 Essential (primary) hypertension: Secondary | ICD-10-CM | POA: Diagnosis not present

## 2020-01-25 DIAGNOSIS — Z6827 Body mass index (BMI) 27.0-27.9, adult: Secondary | ICD-10-CM | POA: Diagnosis not present

## 2020-01-25 DIAGNOSIS — Z008 Encounter for other general examination: Secondary | ICD-10-CM | POA: Diagnosis not present

## 2020-01-25 DIAGNOSIS — Z0000 Encounter for general adult medical examination without abnormal findings: Secondary | ICD-10-CM | POA: Diagnosis not present

## 2020-01-25 DIAGNOSIS — E663 Overweight: Secondary | ICD-10-CM | POA: Diagnosis not present

## 2020-02-14 DIAGNOSIS — R350 Frequency of micturition: Secondary | ICD-10-CM | POA: Diagnosis not present

## 2020-02-27 DIAGNOSIS — R509 Fever, unspecified: Secondary | ICD-10-CM | POA: Diagnosis not present

## 2020-02-27 DIAGNOSIS — I10 Essential (primary) hypertension: Secondary | ICD-10-CM | POA: Diagnosis not present

## 2020-02-27 DIAGNOSIS — Z008 Encounter for other general examination: Secondary | ICD-10-CM | POA: Diagnosis not present

## 2020-02-27 DIAGNOSIS — Z8546 Personal history of malignant neoplasm of prostate: Secondary | ICD-10-CM | POA: Diagnosis not present

## 2020-02-27 DIAGNOSIS — E663 Overweight: Secondary | ICD-10-CM | POA: Diagnosis not present

## 2020-02-27 DIAGNOSIS — E0590 Thyrotoxicosis, unspecified without thyrotoxic crisis or storm: Secondary | ICD-10-CM | POA: Diagnosis not present

## 2020-02-27 DIAGNOSIS — Z6827 Body mass index (BMI) 27.0-27.9, adult: Secondary | ICD-10-CM | POA: Diagnosis not present

## 2020-03-13 DIAGNOSIS — R350 Frequency of micturition: Secondary | ICD-10-CM | POA: Diagnosis not present

## 2020-03-27 DIAGNOSIS — E0590 Thyrotoxicosis, unspecified without thyrotoxic crisis or storm: Secondary | ICD-10-CM | POA: Diagnosis not present

## 2020-04-03 DIAGNOSIS — R350 Frequency of micturition: Secondary | ICD-10-CM | POA: Diagnosis not present

## 2020-06-05 DIAGNOSIS — R350 Frequency of micturition: Secondary | ICD-10-CM | POA: Diagnosis not present

## 2020-06-19 ENCOUNTER — Ambulatory Visit: Payer: Medicare Other | Admitting: Nurse Practitioner

## 2020-06-19 ENCOUNTER — Ambulatory Visit: Payer: Medicare Other

## 2020-07-03 DIAGNOSIS — R350 Frequency of micturition: Secondary | ICD-10-CM | POA: Diagnosis not present

## 2020-07-04 DIAGNOSIS — Z23 Encounter for immunization: Secondary | ICD-10-CM | POA: Diagnosis not present

## 2020-07-08 DIAGNOSIS — S82142A Displaced bicondylar fracture of left tibia, initial encounter for closed fracture: Secondary | ICD-10-CM | POA: Diagnosis not present

## 2020-07-08 DIAGNOSIS — S8992XA Unspecified injury of left lower leg, initial encounter: Secondary | ICD-10-CM | POA: Diagnosis not present

## 2020-07-08 DIAGNOSIS — M25462 Effusion, left knee: Secondary | ICD-10-CM | POA: Diagnosis not present

## 2020-07-08 DIAGNOSIS — S82122A Displaced fracture of lateral condyle of left tibia, initial encounter for closed fracture: Secondary | ICD-10-CM | POA: Diagnosis not present

## 2020-07-29 DIAGNOSIS — S82122D Displaced fracture of lateral condyle of left tibia, subsequent encounter for closed fracture with routine healing: Secondary | ICD-10-CM | POA: Diagnosis not present

## 2020-07-29 DIAGNOSIS — M25562 Pain in left knee: Secondary | ICD-10-CM | POA: Diagnosis not present

## 2020-08-08 DIAGNOSIS — E0590 Thyrotoxicosis, unspecified without thyrotoxic crisis or storm: Secondary | ICD-10-CM | POA: Diagnosis not present

## 2020-08-18 DIAGNOSIS — Z008 Encounter for other general examination: Secondary | ICD-10-CM | POA: Diagnosis not present

## 2020-08-18 DIAGNOSIS — I10 Essential (primary) hypertension: Secondary | ICD-10-CM | POA: Diagnosis not present

## 2020-08-18 DIAGNOSIS — E663 Overweight: Secondary | ICD-10-CM | POA: Diagnosis not present

## 2020-08-18 DIAGNOSIS — Z8781 Personal history of (healed) traumatic fracture: Secondary | ICD-10-CM | POA: Diagnosis not present

## 2020-08-18 DIAGNOSIS — Z6826 Body mass index (BMI) 26.0-26.9, adult: Secondary | ICD-10-CM | POA: Diagnosis not present

## 2020-08-19 DIAGNOSIS — S82122D Displaced fracture of lateral condyle of left tibia, subsequent encounter for closed fracture with routine healing: Secondary | ICD-10-CM | POA: Diagnosis not present

## 2020-08-19 DIAGNOSIS — M25562 Pain in left knee: Secondary | ICD-10-CM | POA: Diagnosis not present

## 2020-08-19 DIAGNOSIS — S82142D Displaced bicondylar fracture of left tibia, subsequent encounter for closed fracture with routine healing: Secondary | ICD-10-CM | POA: Diagnosis not present

## 2020-08-21 DIAGNOSIS — Z7409 Other reduced mobility: Secondary | ICD-10-CM | POA: Diagnosis not present

## 2020-08-26 DIAGNOSIS — Z23 Encounter for immunization: Secondary | ICD-10-CM | POA: Diagnosis not present

## 2020-08-28 DIAGNOSIS — Z7409 Other reduced mobility: Secondary | ICD-10-CM | POA: Diagnosis not present

## 2020-08-28 DIAGNOSIS — R350 Frequency of micturition: Secondary | ICD-10-CM | POA: Diagnosis not present

## 2020-09-02 DIAGNOSIS — Z7409 Other reduced mobility: Secondary | ICD-10-CM | POA: Diagnosis not present

## 2020-09-10 DIAGNOSIS — Z7409 Other reduced mobility: Secondary | ICD-10-CM | POA: Diagnosis not present

## 2020-09-16 DIAGNOSIS — S82122D Displaced fracture of lateral condyle of left tibia, subsequent encounter for closed fracture with routine healing: Secondary | ICD-10-CM | POA: Diagnosis not present

## 2020-10-30 DIAGNOSIS — Z20822 Contact with and (suspected) exposure to covid-19: Secondary | ICD-10-CM | POA: Diagnosis not present
